# Patient Record
Sex: Female | Born: 2000 | Race: White | Hispanic: No | Marital: Single | Smoking: Never smoker
Health system: Southern US, Community
[De-identification: ages and names within clinical notes are randomized; demographics above are authoritative.]

## PROBLEM LIST (undated history)

## (undated) HISTORY — PX: HERNIA REPAIR: SHX51

---
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
## 2018-01-29 ENCOUNTER — Encounter (INDEPENDENT_AMBULATORY_CARE_PROVIDER_SITE_OTHER): Payer: Self-pay

## 2018-01-29 ENCOUNTER — Encounter: Payer: Self-pay | Admitting: Gastroenterology

## 2018-01-29 ENCOUNTER — Ambulatory Visit (INDEPENDENT_AMBULATORY_CARE_PROVIDER_SITE_OTHER): Payer: BLUE CROSS/BLUE SHIELD | Admitting: Gastroenterology

## 2018-01-29 VITALS — BP 108/73 | HR 92 | Wt 133.8 lb

## 2018-01-29 DIAGNOSIS — G8929 Other chronic pain: Secondary | ICD-10-CM | POA: Diagnosis not present

## 2018-01-29 DIAGNOSIS — R1013 Epigastric pain: Secondary | ICD-10-CM | POA: Diagnosis not present

## 2018-01-29 MED ORDER — FAMOTIDINE 10 MG PO TABS: 10.0000 mg | ORAL_TABLET | Freq: Every day | ORAL | 0 refills | Status: AC | PRN

## 2018-01-30 NOTE — Progress Notes (Signed)
Dana BouillonVarnita Aarian Griffie, MD [address]  [address]  [place], [place] [address]  Main: [phone]  Fax: [phone]   Primary Care Physician: Dana Knox, [name], MD  Primary Gastroenterologist:  Dr. Melodie BouillonVarnita Kameela Leipold  Chief Complaint  Patient presents with  . Follow-up    epigastric pain    HPI: Dana SkeansJosephine Knox is a 17 y.o. female here for follow-up of intermittent midepigastric pain.  Reports improvement in frequency of pain.  Has only occurred once or twice in the last month.  No nausea or vomiting.  No heartburn or dysphagia.  No altered bowel habits.  No weight loss.  Good appetite.  On last visit, we had recommended ranitidine once daily, but patient did not take this and is not taking this at this time.  However, she states she takes Tums when pain occurs and this does not usually help.  H. pylori stool antigen was negative.    Current Outpatient Medications  Medication Sig Dispense Refill  . adapalene (DIFFERIN) 0.1 % cream Apply 1/2 fingertip mixed with your favorite moisturizing cream to entire face every night    . ranitidine (ZANTAC) 75 MG tablet Take 1 tablet (75 mg total) by mouth every morning. 30 tablet 0  . spironolactone (ALDACTONE) 50 MG tablet   6  . TRI-SPRINTEC 0.18/0.215/0.25 MG-35 MCG tablet TK 1 T PO QD  11  . famotidine (PEPCID) 10 MG tablet Take 1 tablet (10 mg total) by mouth daily as needed for heartburn or indigestion. 30 tablet 0   No current facility-administered medications for this visit.     Allergies as of 01/29/2018  . (No Known Allergies)    ROS:  General: Negative for anorexia, weight loss, fever, chills, fatigue, weakness. ENT: Negative for hoarseness, difficulty swallowing , nasal congestion. CV: Negative for chest pain, angina, palpitations, dyspnea on exertion, peripheral edema.  Respiratory: Negative for dyspnea at rest, dyspnea on exertion, cough, sputum, wheezing.  GI: See history of present illness. GU:  Negative for  dysuria, hematuria, urinary incontinence, urinary frequency, nocturnal urination.  Endo: Negative for unusual weight change.    Physical Examination:   BP 108/73   Pulse 92   Wt 133 lb 12.8 oz (60.7 kg)   BMI 21.60 kg/m   General: Well-nourished, well-developed in no acute distress.  Eyes: No icterus. Conjunctivae pink. Mouth: Oropharyngeal mucosa moist and pink , no lesions erythema or exudate. Neck: Supple, Trachea midline Abdomen: Bowel sounds are normal, nontender, nondistended, no hepatosplenomegaly or masses, no abdominal bruits or hernia , no rebound or guarding.   Extremities: No lower extremity edema. No clubbing or deformities. Neuro: Alert and oriented x 3.  Grossly intact. Skin: Warm and dry, no jaundice.   Psych: Alert and cooperative, normal mood and affect.   Labs: CMP     Component Value Date/Time   NA 139 11/01/2017 1610   K 3.6 11/01/2017 1610   CL 107 11/01/2017 1610   CO2 27 11/01/2017 1610   GLUCOSE 84 11/01/2017 1610   BUN 9 11/01/2017 1610   CREATININE 0.89 11/01/2017 1610   CALCIUM 9.6 11/01/2017 1610   PROT 7.3 11/01/2017 1610   ALBUMIN 4.3 11/01/2017 1610   AST 25 11/01/2017 1610   ALT 21 11/01/2017 1610   ALKPHOS 55 11/01/2017 1610   BILITOT 1.8 (H) 12/11/2017 1304   GFRNONAA >60 11/01/2017 1610   GFRAA >60 11/01/2017 1610   Lab Results  Component Value Date   WBC 14.4 (H) 09/01/2017   HGB 15.6 09/01/2017  HCT 45.3 09/01/2017   MCV 95.8 09/01/2017   PLT 255 09/01/2017    Imaging Studies: No results found.  Assessment and Plan:   Dana Knox is a 17 y.o. y/o female here for follow-up of intermittent abdominal pain  Symptoms have improved and become less frequent However, patient does state that when the pain does occur, even though it is occasional, she has to lay down and does not like doing anything.  Does think that it may be somewhat stress related. Tums does not help her when she takes it during the episodes of  pain She is willing to try Pepcid and we have sent it to the pharmacy to be used as needed on the days of the pain. If pain becomes more frequent or the medication does not help the pain she was asked to contact us and she verbalized understanding Stool for H. pylori, labs and imaging are reassuring No alarm symptoms present  I have discussed the option of undergoing an EGD to evaluate for any underlying lesions, but she would like to proceed with conservative management at this time and is not interested in an EGD.  Risks and benefits of the procedure were discussed in detail and she verbalized understanding.  She will call us if symptoms do not improve and will consider EGD at that time.  History of chronically elevated bilirubin at 1.3, from [place] syndrome, even normal direct bilirubin.  Other liver work-up normal.  No further work-up needed for this.  Ultrasound reassuring.    Dr Dana Bouillon

## 2019-01-05 ENCOUNTER — Other Ambulatory Visit: Payer: Self-pay

## 2019-01-05 DIAGNOSIS — Z20822 Contact with and (suspected) exposure to covid-19: Secondary | ICD-10-CM

## 2019-01-07 ENCOUNTER — Other Ambulatory Visit: Payer: Self-pay | Admitting: *Deleted

## 2019-01-07 DIAGNOSIS — Z20822 Contact with and (suspected) exposure to covid-19: Secondary | ICD-10-CM

## 2019-01-07 LAB — NOVEL CORONAVIRUS, NAA: SARS-CoV-2, NAA: NOT DETECTED

## 2019-01-08 LAB — NOVEL CORONAVIRUS, NAA: SARS-CoV-2, NAA: NOT DETECTED

## 2019-01-13 ENCOUNTER — Telehealth: Payer: Self-pay | Admitting: Family Medicine

## 2019-01-13 NOTE — Telephone Encounter (Signed)
° °  Pt rec neg COVID results °

## 2019-07-20 IMAGING — US US ABDOMEN COMPLETE
1 series · 14 of 25 positions shown · non-contrast
Comparison: None.

CLINICAL DATA: Recurrent abdominal pain.

EXAM:
ABDOMEN ULTRASOUND COMPLETE

[Series 1: us abdomen complete · 0.20mm/px · 14 of 85 slices shown]
[im 1/85]
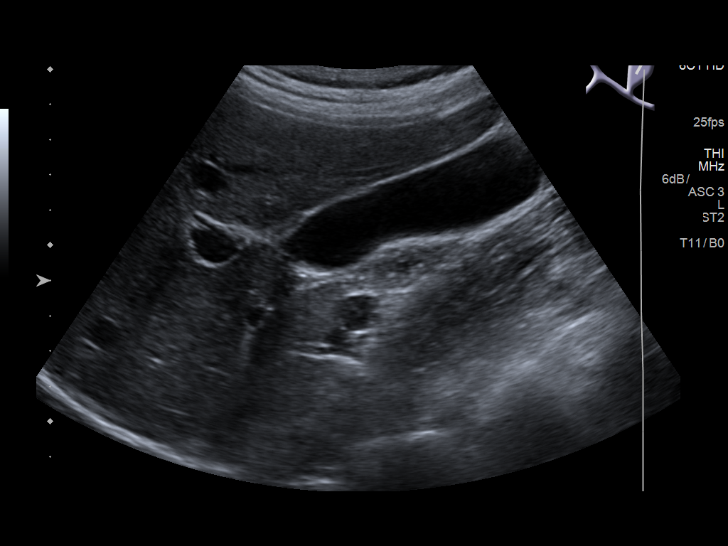
[im 8/85]
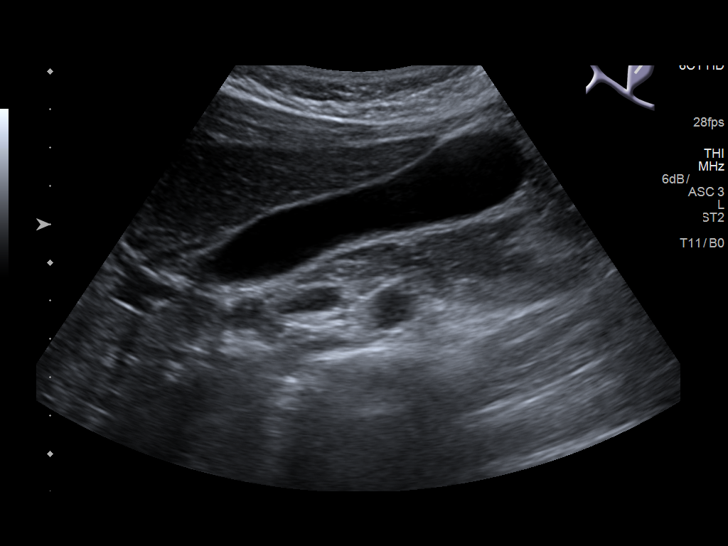
[im 15/85]
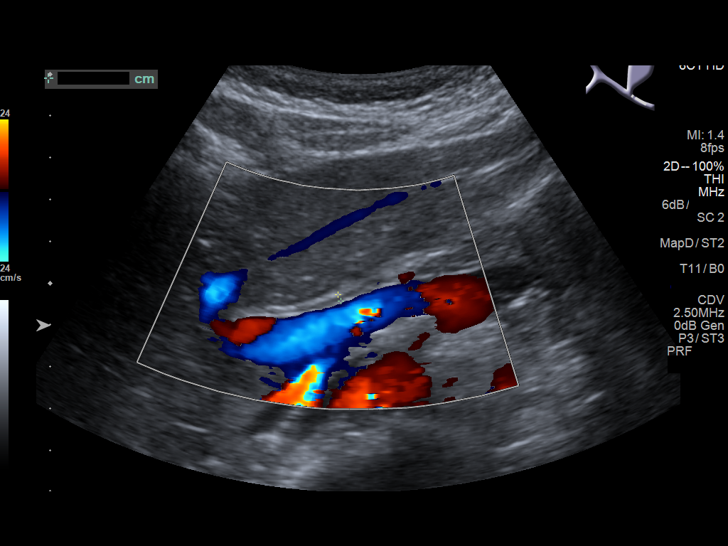
[im 22/85]
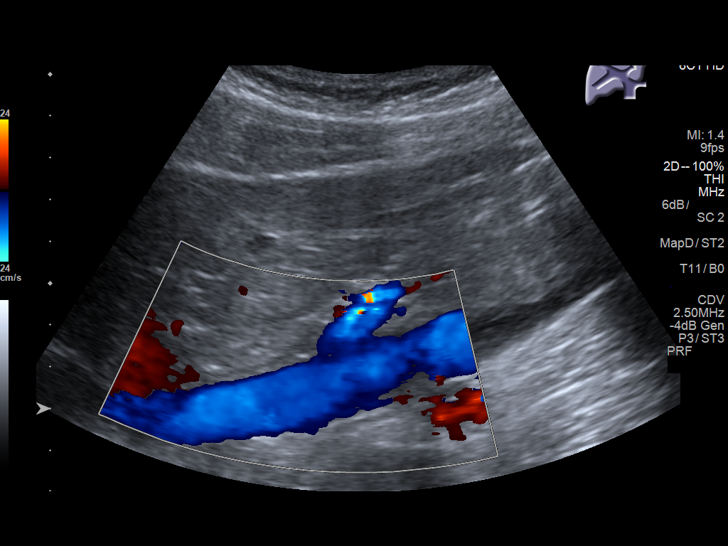
[im 29/85]
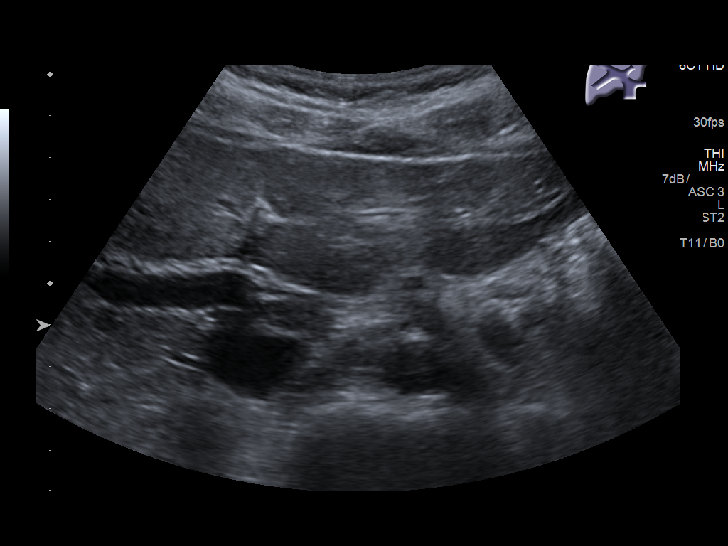
[im 32/85]
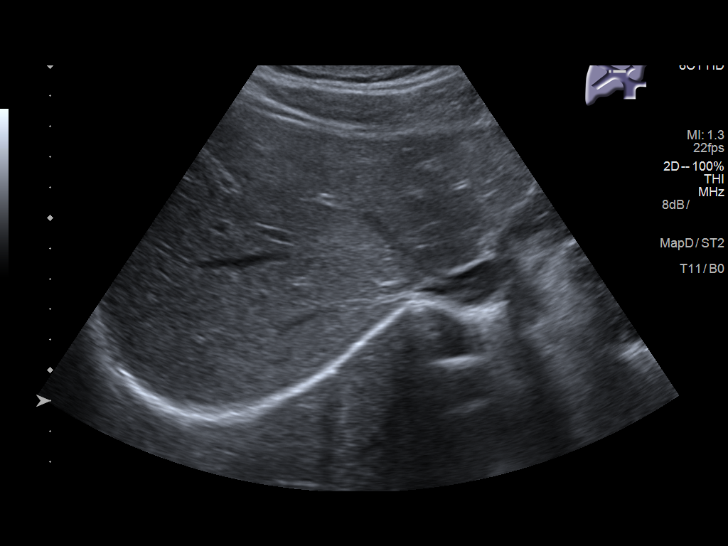
[im 39/85]
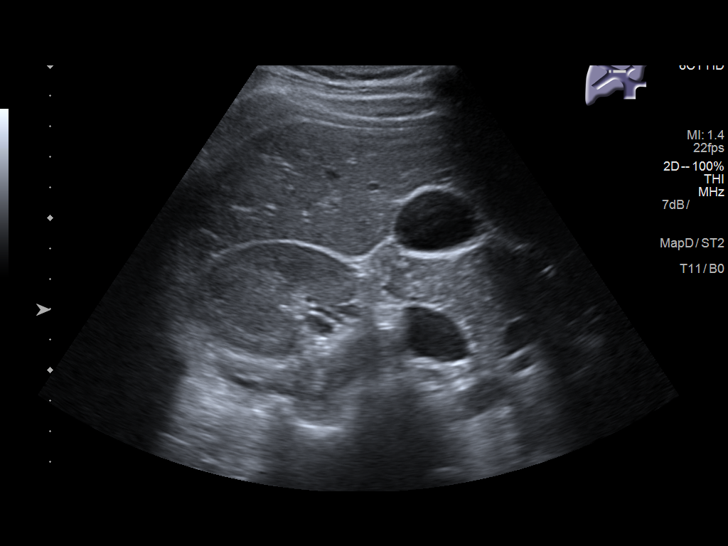
[im 46/85]
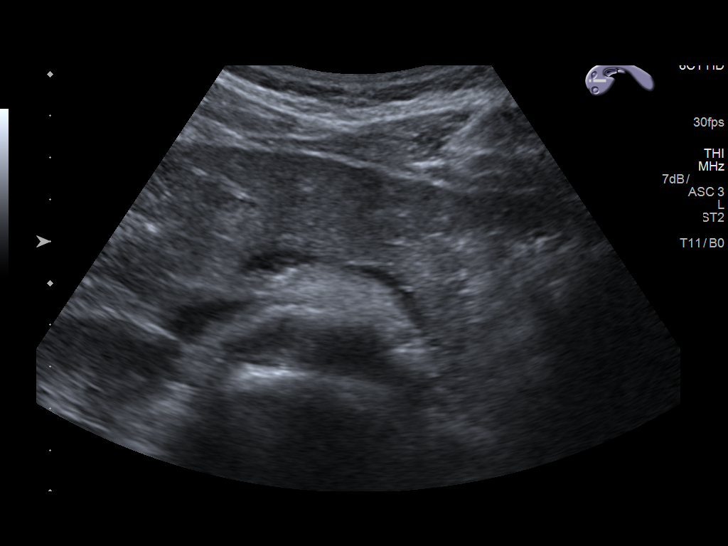
[im 53/85]
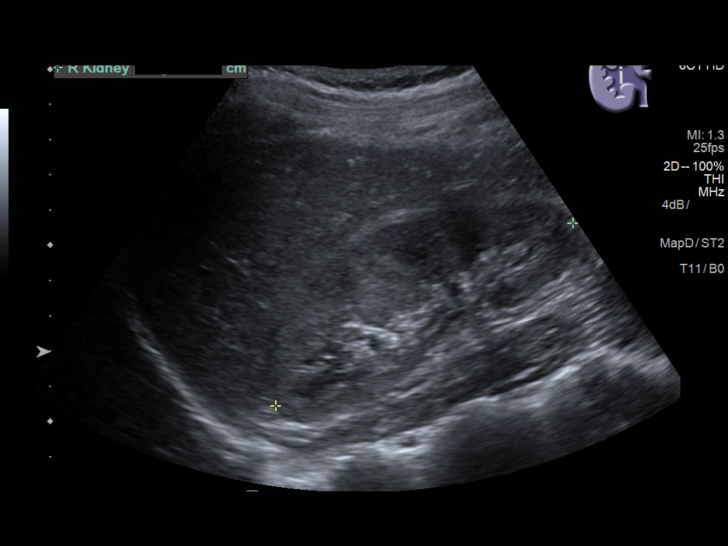
[im 57/85]
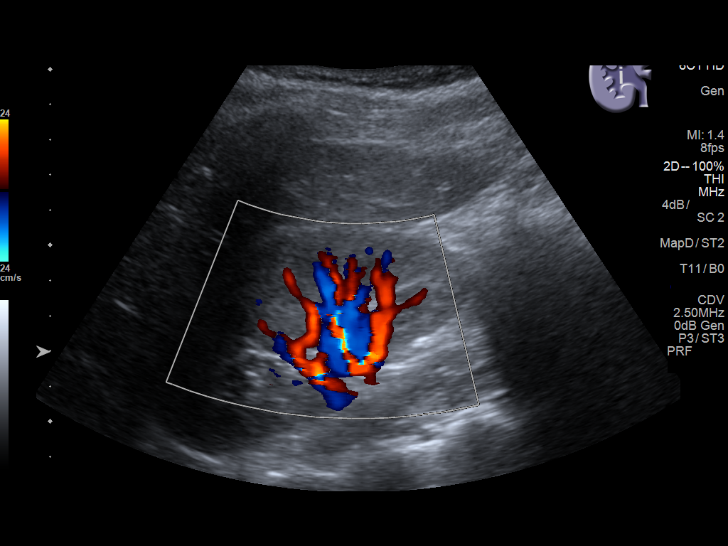
[im 64/85]
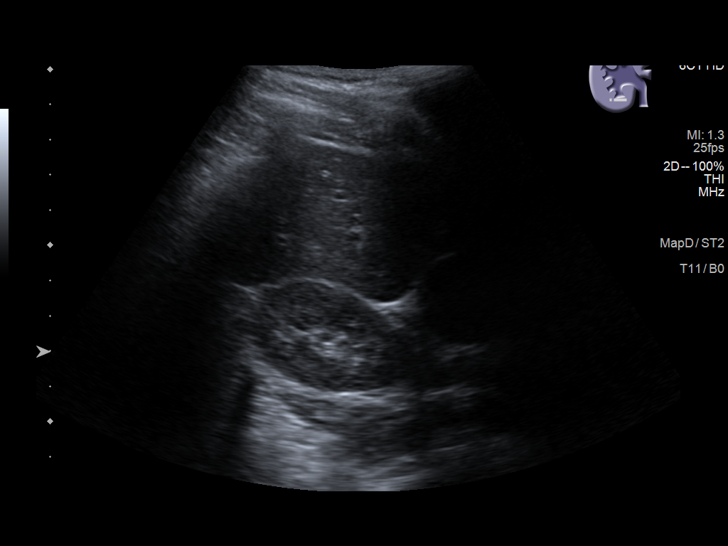
[im 71/85]
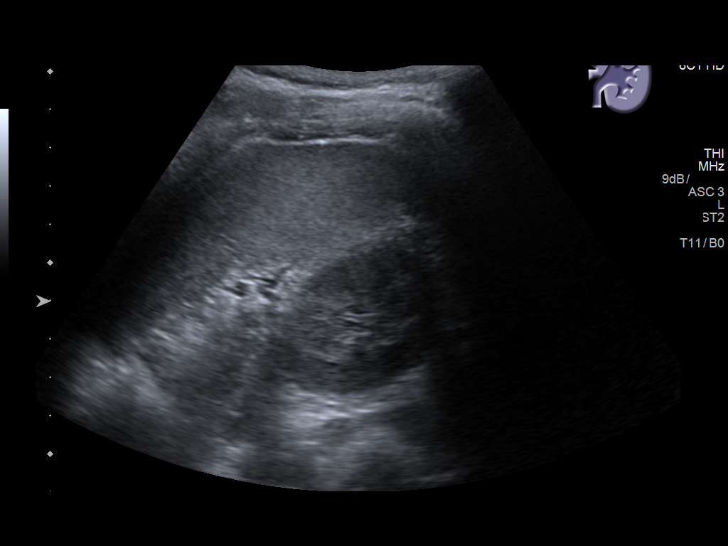
[im 78/85]
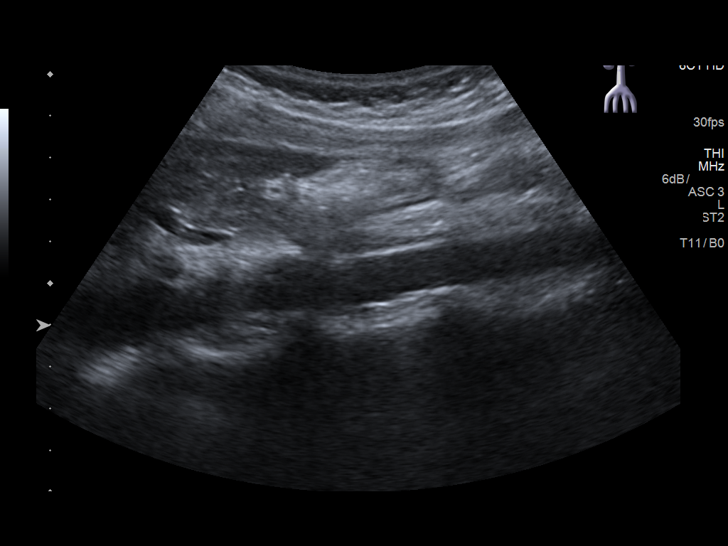
[im 85/85]
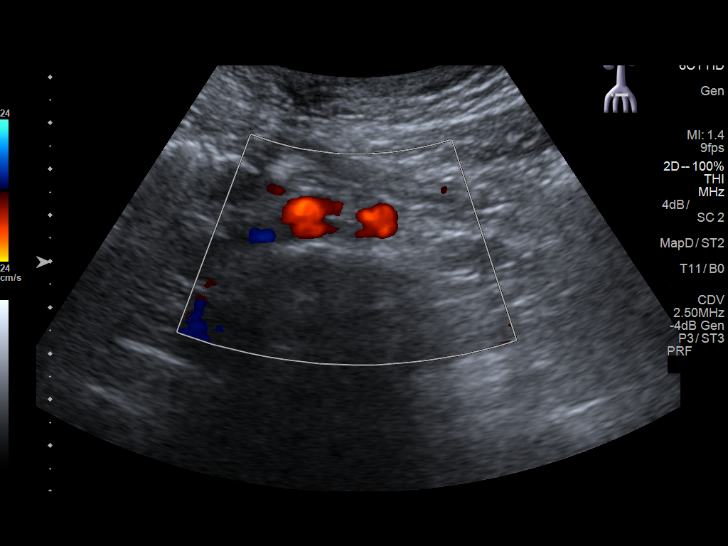

[14 of 25 positions shown; findings below may reference images not displayed]

FINDINGS: Gallbladder: No gallstones or wall thickening visualized. No
sonographic Murphy sign noted by sonographer.

Common bile duct: Diameter: Normal 2 mm

Liver: No focal lesion identified. Within normal limits in
parenchymal echogenicity. Portal vein is patent on color Doppler
imaging with normal direction of blood flow towards the liver.

IVC: No abnormality visualized.

Pancreas: Visualized portion unremarkable.

Spleen: Size and appearance within normal limits.

Right Kidney: Length: 9.9 cm. Echogenicity within normal limits. No
mass or hydronephrosis visualized.

Left Kidney: Length: 9.8 cm. Echogenicity within normal limits. No
mass or hydronephrosis visualized.

Abdominal aorta: No aneurysm visualized.

Other findings: None.
IMPRESSION: Normal abdominal ultrasound.
